# Patient Record
Sex: Female | Born: 1950 | Race: White | Smoking: Never smoker
Health system: Southern US, Community
[De-identification: ages and names within clinical notes are randomized; demographics above are authoritative.]

## PROBLEM LIST (undated history)

## (undated) DIAGNOSIS — H8109 Meniere's disease, unspecified ear: Secondary | ICD-10-CM

## (undated) DIAGNOSIS — R42 Dizziness and giddiness: Secondary | ICD-10-CM

## (undated) HISTORY — PX: AUGMENTATION MAMMAPLASTY: SUR837

## (undated) HISTORY — PX: ABDOMINAL HYSTERECTOMY: SHX81

---

## 2010-10-03 ENCOUNTER — Other Ambulatory Visit: Payer: Self-pay | Admitting: Family Medicine

## 2010-10-03 DIAGNOSIS — Z1231 Encounter for screening mammogram for malignant neoplasm of breast: Secondary | ICD-10-CM

## 2010-10-16 ENCOUNTER — Ambulatory Visit: Payer: Self-pay

## 2010-10-17 ENCOUNTER — Ambulatory Visit
Admission: RE | Admit: 2010-10-17 | Discharge: 2010-10-17 | Disposition: A | Discharge status: Home / Self Care | Payer: BC Managed Care – PPO | Source: Ambulatory Visit | Attending: Family Medicine | Admitting: Family Medicine

## 2010-10-17 DIAGNOSIS — Z1231 Encounter for screening mammogram for malignant neoplasm of breast: Secondary | ICD-10-CM

## 2010-10-18 ENCOUNTER — Ambulatory Visit: Payer: Self-pay

## 2012-10-26 ENCOUNTER — Emergency Department (HOSPITAL_COMMUNITY): Payer: BC Managed Care – PPO

## 2012-10-26 ENCOUNTER — Encounter (HOSPITAL_COMMUNITY): Payer: Self-pay | Admitting: Emergency Medicine

## 2012-10-26 ENCOUNTER — Emergency Department (HOSPITAL_COMMUNITY)
Admission: EM | Admit: 2012-10-26 | Discharge: 2012-10-26 | Disposition: A | Discharge status: Home / Self Care | Payer: BC Managed Care – PPO | Attending: Emergency Medicine | Admitting: Emergency Medicine

## 2012-10-26 DIAGNOSIS — R110 Nausea: Secondary | ICD-10-CM | POA: Insufficient documentation

## 2012-10-26 DIAGNOSIS — R55 Syncope and collapse: Secondary | ICD-10-CM

## 2012-10-26 DIAGNOSIS — R42 Dizziness and giddiness: Secondary | ICD-10-CM | POA: Insufficient documentation

## 2012-10-26 DIAGNOSIS — R5381 Other malaise: Secondary | ICD-10-CM | POA: Insufficient documentation

## 2012-10-26 DIAGNOSIS — R51 Headache: Secondary | ICD-10-CM | POA: Insufficient documentation

## 2012-10-26 DIAGNOSIS — IMO0001 Reserved for inherently not codable concepts without codable children: Secondary | ICD-10-CM | POA: Insufficient documentation

## 2012-10-26 DIAGNOSIS — M542 Cervicalgia: Secondary | ICD-10-CM | POA: Insufficient documentation

## 2012-10-26 DIAGNOSIS — H8109 Meniere's disease, unspecified ear: Secondary | ICD-10-CM | POA: Insufficient documentation

## 2012-10-26 HISTORY — DX: Dizziness and giddiness: R42

## 2012-10-26 HISTORY — DX: Meniere's disease, unspecified ear: H81.09

## 2012-10-26 LAB — CBC WITH DIFFERENTIAL/PLATELET
Basophils Relative: 1 % (ref 0–1)
Eosinophils Absolute: 0.3 10*3/uL (ref 0.0–0.7)
Hemoglobin: 14.8 g/dL (ref 12.0–15.0)
MCH: 31 pg (ref 26.0–34.0)
MCHC: 34.1 g/dL (ref 30.0–36.0)
Monocytes Absolute: 0.4 10*3/uL (ref 0.1–1.0)
Monocytes Relative: 9 % (ref 3–12)
Neutrophils Relative %: 62 % (ref 43–77)

## 2012-10-26 LAB — URINE MICROSCOPIC-ADD ON

## 2012-10-26 LAB — URINALYSIS, ROUTINE W REFLEX MICROSCOPIC
Nitrite: NEGATIVE
Specific Gravity, Urine: 1.011 (ref 1.005–1.030)
pH: 5 (ref 5.0–8.0)

## 2012-10-26 LAB — COMPREHENSIVE METABOLIC PANEL
Albumin: 4 g/dL (ref 3.5–5.2)
BUN: 18 mg/dL (ref 6–23)
Calcium: 9.4 mg/dL (ref 8.4–10.5)
Creatinine, Ser: 0.73 mg/dL (ref 0.50–1.10)
Total Protein: 7.1 g/dL (ref 6.0–8.3)

## 2012-10-26 LAB — D-DIMER, QUANTITATIVE: D-Dimer, Quant: <0.27 ug/mL-FEU (ref 0.00–0.48)

## 2012-10-26 MED ORDER — SODIUM CHLORIDE 0.9 % IV BOLUS (SEPSIS)
1000.0000 mL | Freq: Once | INTRAVENOUS | Status: AC
  Administered 2012-10-26: 1000 mL via INTRAVENOUS

## 2012-10-26 NOTE — ED Notes (Signed)
Patient ambulating pushing a shopping cart at [employer] syncope witnessed approx 10-20 seconds. EMS arrived ax4 answering and following commands appropriate. Nausea given zofran 4mg  IVP CBG 93.

## 2012-10-26 NOTE — ED Provider Notes (Signed)
History     CSN: [id number]  Arrival date & time 10/26/12  1207   First MD Initiated Contact with Patient 10/26/12 1216      Chief Complaint  Patient presents with  . Loss of Consciousness    (Consider location/radiation/quality/duration/timing/severity/associated sxs/prior treatment) HPI Comments: Patient arrives via EMS after syncopal episode in [place]. She remembers walking behind her cart, feeling lightheaded and dizzy with blurry vision, hot flushing and nausea. The next thing the patient she knows she was on the ground. She denies any chest pain or shortness of breath. She denies any previous cardiac history. She does have Mnire's disease for which he does not take any medications. She states the dizziness is different than her Mnire's and she denies any vertigo. She endorses nausea for the past several days with gradual onset headache that is similar to previous migraines. Denies any vision changes.  The history is provided by the patient and the EMS personnel.    Past Medical History  Diagnosis Date  . Dizziness   . Meniere's disease     History reviewed. No pertinent past surgical history.  No family history on file.  History  Substance Use Topics  . Smoking status: Never Smoker   . Smokeless tobacco: Not on file  . Alcohol Use: No    OB History   Grav Para Term Preterm Abortions TAB SAB Ect Mult Living                  Review of Systems  Constitutional: Positive for fatigue. Negative for fever and activity change.  HENT: Positive for neck pain. Negative for congestion and rhinorrhea.   Respiratory: Negative for cough, chest tightness and shortness of breath.   Cardiovascular: Positive for syncope. Negative for chest pain.  Gastrointestinal: Positive for nausea. Negative for vomiting and abdominal pain.  Genitourinary: Negative for dysuria and hematuria.  Musculoskeletal: Positive for myalgias and arthralgias.  Neurological: Positive for dizziness,  weakness, light-headedness and headaches.  A complete 10 system review of systems was obtained and all systems are negative except as noted in the HPI and PMH.    Allergies  Codeine  Home Medications   Current Outpatient Rx  Name  Route  Sig  Dispense  Refill  . Aspirin-Acetaminophen-Caffeine (GOODY HEADACHE PO)   Oral   Take 1 Package by mouth once. For headache         . esterified estrogens (MENEST) 0.625 MG tablet   Oral   Take 0.625 mg by mouth daily.         . fexofenadine (ALLEGRA) 180 MG tablet   Oral   Take 180 mg by mouth daily.         Marland Kitchen MAGNESIUM PO   Oral   Take 1 tablet by mouth daily.         . Multiple Vitamin (MULTIVITAMIN WITH MINERALS) TABS   Oral   Take 1 tablet by mouth daily.           BP 131/72  Pulse 86  Temp(Src) 98.4 F (36.9 C) (Oral)  Resp 19  Ht 5' 4.5" (1.638 m)  Wt 165 lb (74.844 kg)  BMI 27.9 kg/m2  SpO2 96%  Physical Exam  Constitutional: She is oriented to person, place, and time. She appears well-developed and well-nourished. No distress.  HENT:  Head: Normocephalic and atraumatic.  Mouth/Throat: Oropharynx is clear and moist. No oropharyngeal exudate.  Eyes: Conjunctivae and EOM are normal. Pupils are equal, round, and reactive to light.  Neck: Normal range of motion. Neck supple.  Cardiovascular: Normal rate, regular rhythm and normal heart sounds.   No murmur heard. Pulmonary/Chest: Effort normal and breath sounds normal. No respiratory distress.  Abdominal: Soft. There is no tenderness. There is no rebound and no guarding.  Musculoskeletal: Normal range of motion. She exhibits no edema and no tenderness.  Neurological: She is alert and oriented to person, place, and time. No cranial nerve deficit. She exhibits normal muscle tone. Coordination normal.  Cranial nerves 2-12 intact, 5 out of 5 strength throughout, no ataxia finger to nose  Skin: Skin is warm.    ED Course  Procedures (including critical care  time)  Labs Reviewed  CBC WITH DIFFERENTIAL - Abnormal; Notable for the following:    Eosinophils Relative 6 (*)    All other components within normal limits  COMPREHENSIVE METABOLIC PANEL - Abnormal; Notable for the following:    ALT 45 (*)    GFR calc non Af Amer 90 (*)    All other components within normal limits  URINALYSIS, ROUTINE W REFLEX MICROSCOPIC - Abnormal; Notable for the following:    Leukocytes, UA TRACE (*)    All other components within normal limits  URINE MICROSCOPIC-ADD ON - Abnormal; Notable for the following:    Squamous Epithelial / LPF FEW (*)    Bacteria, UA FEW (*)    All other components within normal limits  D-DIMER, QUANTITATIVE  TROPONIN I   Ct Head Wo Contrast  10/26/2012  *RADIOLOGY REPORT*  Clinical Data: Syncope  CT HEAD WITHOUT CONTRAST  Technique:  Contiguous axial images were obtained from the base of the skull through the vertex without contrast.  Comparison: None.  Findings: No evidence of parenchymal hemorrhage or extra-axial fluid collection. No mass lesion, mass effect, or midline shift.  No CT evidence of acute infarction.  Cerebral volume is age appropriate.  No ventriculomegaly.  Suspected prior functional endoscopic sinus surgery.  Mastoid air cells are clear.  No evidence of calvarial fracture.  IMPRESSION: No evidence of acute intracranial abnormality.   Original Report Authenticated By: Charline Bills, M.D.      1. Syncope       MDM  Syncopal episode likely vasovagal with prodrome of nausea, blurry vision, hot flushing. No chest pain or shortness of breath. Back to baseline now.   Nonfocal neurological exam. Patient complains of gradual onset headache similar to previous. No chest pain or shortness of breath.  EKG shows no arrhythmias. Lab work is unremarkable. She's no murmurs on exam. Low suspicion for cardiac syncope. She's a history of Mnire's disease endorses a program of dizziness, lightheadedness, nausea and hot  flushing.  She is back to her baseline, tolerating by mouth ambulatory in the hallways. Followup with PCP this week. Return precautions discussed.   Date: 10/26/2012  Rate: 64  Rhythm: normal sinus rhythm  QRS Axis: normal  Intervals: normal  ST/T Wave abnormalities: normal  Conduction Disutrbances:none  Narrative Interpretation:   Old EKG Reviewed: none available       Glynn Octave, MD 10/26/12 1525

## 2012-10-28 ENCOUNTER — Other Ambulatory Visit: Payer: Self-pay | Admitting: Family Medicine

## 2012-10-28 DIAGNOSIS — R2689 Other abnormalities of gait and mobility: Secondary | ICD-10-CM

## 2012-11-01 ENCOUNTER — Ambulatory Visit
Admission: RE | Admit: 2012-11-01 | Discharge: 2012-11-01 | Disposition: A | Discharge status: Home / Self Care | Payer: BC Managed Care – PPO | Source: Ambulatory Visit | Attending: Family Medicine | Admitting: Family Medicine

## 2012-11-01 DIAGNOSIS — R2689 Other abnormalities of gait and mobility: Secondary | ICD-10-CM

## 2013-10-27 ENCOUNTER — Other Ambulatory Visit: Payer: Self-pay

## 2013-10-27 DIAGNOSIS — Z1231 Encounter for screening mammogram for malignant neoplasm of breast: Secondary | ICD-10-CM

## 2013-11-17 ENCOUNTER — Ambulatory Visit
Admission: RE | Admit: 2013-11-17 | Discharge: 2013-11-17 | Disposition: A | Discharge status: Home / Self Care | Payer: BC Managed Care – PPO | Source: Ambulatory Visit

## 2013-11-17 DIAGNOSIS — Z1231 Encounter for screening mammogram for malignant neoplasm of breast: Secondary | ICD-10-CM

## 2016-02-14 ENCOUNTER — Other Ambulatory Visit: Payer: Self-pay | Admitting: Family Medicine

## 2016-02-14 DIAGNOSIS — Z1231 Encounter for screening mammogram for malignant neoplasm of breast: Secondary | ICD-10-CM

## 2016-03-05 ENCOUNTER — Ambulatory Visit
Admission: RE | Admit: 2016-03-05 | Discharge: 2016-03-05 | Disposition: A | Discharge status: Home / Self Care | Payer: Self-pay | Source: Ambulatory Visit | Attending: Family Medicine | Admitting: Family Medicine

## 2016-03-05 DIAGNOSIS — Z1231 Encounter for screening mammogram for malignant neoplasm of breast: Secondary | ICD-10-CM

## 2017-03-10 ENCOUNTER — Other Ambulatory Visit: Payer: Self-pay | Admitting: Gastroenterology

## 2017-03-10 DIAGNOSIS — R1013 Epigastric pain: Secondary | ICD-10-CM

## 2017-03-13 ENCOUNTER — Ambulatory Visit
Admission: RE | Admit: 2017-03-13 | Discharge: 2017-03-13 | Disposition: A | Discharge status: Home / Self Care | Payer: BLUE CROSS/BLUE SHIELD | Source: Ambulatory Visit | Attending: Gastroenterology | Admitting: Gastroenterology

## 2017-03-13 DIAGNOSIS — R1013 Epigastric pain: Secondary | ICD-10-CM

## 2017-03-13 MED ORDER — IOPAMIDOL (ISOVUE-300) INJECTION 61%
100.0000 mL | Freq: Once | INTRAVENOUS | Status: AC | PRN
  Administered 2017-03-13: 100 mL via INTRAVENOUS

## 2017-07-09 ENCOUNTER — Telehealth: Payer: Self-pay | Admitting: *Deleted

## 2017-07-09 ENCOUNTER — Ambulatory Visit (INDEPENDENT_AMBULATORY_CARE_PROVIDER_SITE_OTHER): Payer: BLUE CROSS/BLUE SHIELD | Admitting: Podiatry

## 2017-07-09 ENCOUNTER — Ambulatory Visit (INDEPENDENT_AMBULATORY_CARE_PROVIDER_SITE_OTHER): Payer: BLUE CROSS/BLUE SHIELD

## 2017-07-09 VITALS — BP 145/92 | HR 75 | Ht 64.0 in | Wt 175.0 lb

## 2017-07-09 DIAGNOSIS — S93325A Dislocation of tarsometatarsal joint of left foot, initial encounter: Secondary | ICD-10-CM

## 2017-07-09 DIAGNOSIS — M21611 Bunion of right foot: Secondary | ICD-10-CM

## 2017-07-09 DIAGNOSIS — M79671 Pain in right foot: Secondary | ICD-10-CM | POA: Diagnosis not present

## 2017-07-09 DIAGNOSIS — M2041 Other hammer toe(s) (acquired), right foot: Secondary | ICD-10-CM | POA: Diagnosis not present

## 2017-07-09 MED ORDER — MELOXICAM 15 MG PO TABS: 15.0000 mg | ORAL_TABLET | Freq: Every day | ORAL | 1 refills | Status: DC

## 2017-07-09 NOTE — Telephone Encounter (Deleted)
-----   Message from Brent M Evans, DPM sent at 07/09/2017  4:08 PM EST ----- Regarding: MRI right foot Please order MRI right foot with or without contrast.  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 Evans 
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
## 2017-07-10 NOTE — Telephone Encounter (Signed)
-----   Message from Felecia ShellingBrent M Evans, DPM sent at 07/09/2017  4:08 PM EST ----- Regarding: MRI right foot Please order MRI right foot with or without contrast.  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 Logan BoresEvans

## 2017-07-10 NOTE — Telephone Encounter (Signed)
Orders to J. Quintana, RN for pre-cert, faxed to [facility]. 
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
## 2017-07-17 ENCOUNTER — Other Ambulatory Visit: Payer: Self-pay

## 2017-07-17 NOTE — Progress Notes (Signed)
error 

## 2017-07-18 ENCOUNTER — Ambulatory Visit
Admission: RE | Admit: 2017-07-18 | Discharge: 2017-07-18 | Disposition: A | Discharge status: Home / Self Care | Payer: BLUE CROSS/BLUE SHIELD | Source: Ambulatory Visit | Attending: Podiatry | Admitting: Podiatry

## 2017-08-06 ENCOUNTER — Ambulatory Visit (INDEPENDENT_AMBULATORY_CARE_PROVIDER_SITE_OTHER): Payer: BLUE CROSS/BLUE SHIELD | Admitting: Podiatry

## 2017-08-06 DIAGNOSIS — M76821 Posterior tibial tendinitis, right leg: Secondary | ICD-10-CM | POA: Diagnosis not present

## 2017-08-06 DIAGNOSIS — M779 Enthesopathy, unspecified: Principal | ICD-10-CM

## 2017-08-06 DIAGNOSIS — M7751 Other enthesopathy of right foot: Secondary | ICD-10-CM | POA: Diagnosis not present

## 2017-08-06 DIAGNOSIS — M778 Other enthesopathies, not elsewhere classified: Secondary | ICD-10-CM

## 2017-08-06 MED ORDER — MELOXICAM 15 MG PO TABS: 15.0000 mg | ORAL_TABLET | Freq: Every day | ORAL | 1 refills | Status: AC

## 2017-08-07 ENCOUNTER — Telehealth: Payer: Self-pay | Admitting: Podiatry

## 2017-08-07 NOTE — Telephone Encounter (Signed)
I informed pt Dr. Logan BoresEvans had sent the prescription to [id number] 08/06/2017 at 4:51pm.

## 2017-08-07 NOTE — Telephone Encounter (Signed)
I was there yesterday and saw Dr. Logan BoresEvans. He was supposed to write me a prescription but I did not get anything. I was calling to see if he was going to call that in to my pharmacy [facility] on [address]. If not, please call me back at [phone].

## 2017-08-13 NOTE — Progress Notes (Signed)
   HPI: 67 year old female presenting today for follow-up evaluation of right foot pain.  She reports significant continued pain.  She rates this pain at 4/10 currently.  She is here to discuss her MRI results.  Patient is here for further evaluation and treatment.   Past Medical History:  Diagnosis Date  . Dizziness   . Meniere's disease      Physical Exam: General: The patient is alert and oriented x3 in no acute distress.  Dermatology: Skin is warm, dry and supple bilateral lower extremities. Negative for open lesions or macerations.  Vascular: Palpable pedal pulses bilaterally. No edema or erythema noted. Capillary refill within normal limits.  Neurological: Epicritic and protective threshold grossly intact bilaterally.   Musculoskeletal Exam: Pain with palpation to the right midfoot.  Pain with palpation at the insertion of the posterior tibial tendon of the right foot.  Range of motion within normal limits to all pedal and ankle joints bilateral. Muscle strength 5/5 in all groups bilateral.   MRI results: 1.  Intact Lisfranc ligament.  No acute fracture or dislocation of the right forefoot. 2.  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 3.  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 4.  Mild edema in the medial hallux sesamoid likely reflecting mild sesamoiditis.  Assessment: -Right midfoot capsulitis -Insertional PT tendinitis right -OA right foot   Plan of Care:  -Patient evaluated.  MRI reviewed. - Injection of 0.5 mLs Celestone Soluspan injected into the right midfoot. - Injection of 0.5 mLs Celestone Soluspan injected into the insertion of the right PT tendon.  Care was taken to avoid direct injection of the tendon. -Cam boot dispensed.  Weightbearing as tolerated for 4 weeks. -Prescription for Mobic provided to patient. -Return to clinic in 4  weeks.   Felecia ShellingBrent M. Gladyes Kudo, DPM [facility]  Dr. Felecia ShellingBrent M. Keyuana Wank, DPM    [address]                                        [place], [place] [address]                Office [phone]  Fax [phone]

## 2017-09-02 ENCOUNTER — Telehealth: Payer: Self-pay | Admitting: *Deleted

## 2017-09-02 NOTE — Telephone Encounter (Signed)
Request #90 meloxicam. Dr. Logan BoresEvans denies refill states pt needs to be seen prior to refills. Return fax denying.

## 2017-09-03 ENCOUNTER — Ambulatory Visit (INDEPENDENT_AMBULATORY_CARE_PROVIDER_SITE_OTHER): Payer: BLUE CROSS/BLUE SHIELD | Admitting: Podiatry

## 2017-09-03 DIAGNOSIS — M19171 Post-traumatic osteoarthritis, right ankle and foot: Secondary | ICD-10-CM

## 2017-09-03 DIAGNOSIS — M7751 Other enthesopathy of right foot: Secondary | ICD-10-CM | POA: Diagnosis not present

## 2017-09-03 DIAGNOSIS — M778 Other enthesopathies, not elsewhere classified: Secondary | ICD-10-CM

## 2017-09-03 DIAGNOSIS — M779 Enthesopathy, unspecified: Principal | ICD-10-CM

## 2017-09-03 MED ORDER — DICLOFENAC SODIUM 75 MG PO TBEC: 75.0000 mg | DELAYED_RELEASE_TABLET | Freq: Two times a day (BID) | ORAL | 1 refills | Status: DC

## 2017-09-03 MED ORDER — METHYLPREDNISOLONE 4 MG PO TBPK: ORAL_TABLET | ORAL | 0 refills | Status: DC

## 2017-09-06 NOTE — Progress Notes (Signed)
   HPI: 67 year old female presenting today for follow-up evaluation of right foot pain. She reports some improvement of the right foot since the previous visit. She states wearing the CAM boot is beginning to cause pain in the left hip. She states the injections helped alleviate her pain but she is unsure if Mobic is providing any relief. Patient is here for further evaluation and treatment.   Past Medical History:  Diagnosis Date  . Dizziness   . Meniere's disease      Physical Exam: General: The patient is alert and oriented x3 in no acute distress.  Dermatology: Skin is warm, dry and supple bilateral lower extremities. Negative for open lesions or macerations.  Vascular: Palpable pedal pulses bilaterally. No edema or erythema noted. Capillary refill within normal limits.  Neurological: Epicritic and protective threshold grossly intact bilaterally.   Musculoskeletal Exam: Pain with palpation to the right midfoot. No pain with palpation at the insertion of the posterior tibial tendon of the right foot.  Range of motion within normal limits to all pedal and ankle joints bilateral. Muscle strength 5/5 in all groups bilateral.   Assessment: - Right midfoot OA/capsulitis - Insertional PT tendinitis right - resolved  Plan of Care:  - Patient evaluated.   - Injection of 0.5 mLs Celestone Soluspan injected into the right midfoot. - Discontinue wearing CAM boot. Transition into good shoes and ankle brace. - Ankle brace dispensed. - Prescription for Medrol Dose Pak provided to patient. - Prescription for Diclofenac 75 mg provided to patient. Discontinue taking Meloxicam 15 mg. - Return to clinic in 4 weeks.  Felecia ShellingBrent M. Evans, DPM [facility]  Dr. Felecia ShellingBrent M. Evans, DPM    [address]                                        [place], [place] [address]                Office [phone]  Fax [phone]) [phone]

## 2017-09-29 ENCOUNTER — Other Ambulatory Visit: Payer: Self-pay | Admitting: Nurse Practitioner

## 2017-09-29 ENCOUNTER — Telehealth: Payer: Self-pay | Admitting: *Deleted

## 2017-09-29 ENCOUNTER — Other Ambulatory Visit: Payer: Self-pay | Admitting: Family Medicine

## 2017-09-29 ENCOUNTER — Ambulatory Visit
Admission: RE | Admit: 2017-09-29 | Discharge: 2017-09-29 | Disposition: A | Discharge status: Home / Self Care | Payer: BLUE CROSS/BLUE SHIELD | Source: Ambulatory Visit | Attending: Family Medicine | Admitting: Family Medicine

## 2017-09-29 DIAGNOSIS — J40 Bronchitis, not specified as acute or chronic: Secondary | ICD-10-CM

## 2017-09-29 MED ORDER — DICLOFENAC SODIUM 75 MG PO TBEC: 75.0000 mg | DELAYED_RELEASE_TABLET | Freq: Two times a day (BID) | ORAL | 0 refills | Status: DC

## 2017-09-29 NOTE — Telephone Encounter (Signed)
Request for 180 Diclofenac. Dr. Philomena DohenyEvans okayed fill as #180.

## 2017-10-01 ENCOUNTER — Ambulatory Visit (INDEPENDENT_AMBULATORY_CARE_PROVIDER_SITE_OTHER): Payer: BLUE CROSS/BLUE SHIELD | Admitting: Podiatry

## 2017-10-01 DIAGNOSIS — M21619 Bunion of unspecified foot: Secondary | ICD-10-CM

## 2017-10-01 DIAGNOSIS — M2041 Other hammer toe(s) (acquired), right foot: Secondary | ICD-10-CM | POA: Diagnosis not present

## 2017-10-01 DIAGNOSIS — M779 Enthesopathy, unspecified: Principal | ICD-10-CM

## 2017-10-01 DIAGNOSIS — M19171 Post-traumatic osteoarthritis, right ankle and foot: Secondary | ICD-10-CM | POA: Diagnosis not present

## 2017-10-01 DIAGNOSIS — M7751 Other enthesopathy of right foot: Secondary | ICD-10-CM | POA: Diagnosis not present

## 2017-10-01 DIAGNOSIS — M778 Other enthesopathies, not elsewhere classified: Secondary | ICD-10-CM

## 2017-10-03 NOTE — Progress Notes (Signed)
   HPI: 67 year old female presenting today for follow-up evaluation of right foot pain. She reports continued aching pain of the foot and has been wearing the ankle brace as directed. She states she is unsure if the brace is helping or not. Patient is here for further evaluation and treatment.   Past Medical History:  Diagnosis Date  . Dizziness   . Meniere's disease      Physical Exam: General: The patient is alert and oriented x3 in no acute distress.  Dermatology: Skin is warm, dry and supple bilateral lower extremities. Negative for open lesions or macerations.  Vascular: Palpable pedal pulses bilaterally. No edema or erythema noted. Capillary refill within normal limits.  Neurological: Epicritic and protective threshold grossly intact bilaterally.   Musculoskeletal Exam: Clinical evidence of bunion deformity noted to the respective foot. There is a moderate pain on palpation range of motion of the first MPJ. Lateral deviation of the hallux noted consistent with hallux abductovalgus.  Hammertoe contracture also noted on clinical exam to the 2nd digit of the right foot. Symptomatic pain on palpation and range of motion also noted to the metatarsal phalangeal joints of the respective hammertoe digits.     Assessment: - Right midfoot OA/capsulitis - resolved - Insertional PT tendinitis right - resolved - HAV w/ bunion deformity right foot  - Hammertoe right 2nd toe   Plan of Care:  - Patient evaluated.   - Continue wearing good shoe gear.  - May resume full activity with no restrictions.  - Discussed possible need for bunionectomy and hammertoe repair surgery if symptoms worsen.  - Return to clinic as needed.   Felecia ShellingBrent M. Evans, DPM [facility]  Dr. Felecia ShellingBrent M. Evans, DPM    [address]                                        [place], [place] [address]                Office [phone]  Fax [phone]

## 2018-02-09 ENCOUNTER — Other Ambulatory Visit: Payer: Self-pay | Admitting: Family Medicine

## 2018-02-09 DIAGNOSIS — Z1231 Encounter for screening mammogram for malignant neoplasm of breast: Secondary | ICD-10-CM

## 2018-03-05 ENCOUNTER — Ambulatory Visit
Admission: RE | Admit: 2018-03-05 | Discharge: 2018-03-05 | Disposition: A | Discharge status: Home / Self Care | Payer: BLUE CROSS/BLUE SHIELD | Source: Ambulatory Visit | Attending: Family Medicine | Admitting: Family Medicine

## 2018-03-05 DIAGNOSIS — Z1231 Encounter for screening mammogram for malignant neoplasm of breast: Secondary | ICD-10-CM

## 2018-03-06 ENCOUNTER — Other Ambulatory Visit: Payer: Self-pay | Admitting: Family Medicine

## 2018-03-06 DIAGNOSIS — R928 Other abnormal and inconclusive findings on diagnostic imaging of breast: Secondary | ICD-10-CM

## 2018-03-12 ENCOUNTER — Ambulatory Visit: Admission: RE | Admit: 2018-03-12 | Payer: BLUE CROSS/BLUE SHIELD | Source: Ambulatory Visit

## 2018-03-12 ENCOUNTER — Ambulatory Visit
Admission: RE | Admit: 2018-03-12 | Discharge: 2018-03-12 | Disposition: A | Discharge status: Home / Self Care | Payer: BLUE CROSS/BLUE SHIELD | Source: Ambulatory Visit | Attending: Family Medicine | Admitting: Family Medicine

## 2018-03-12 DIAGNOSIS — R928 Other abnormal and inconclusive findings on diagnostic imaging of breast: Secondary | ICD-10-CM

## 2018-04-14 ENCOUNTER — Telehealth: Payer: Self-pay | Admitting: *Deleted

## 2018-04-14 MED ORDER — DICLOFENAC SODIUM 75 MG PO TBEC: 75.0000 mg | DELAYED_RELEASE_TABLET | Freq: Two times a day (BID) | ORAL | 0 refills | Status: DC

## 2018-04-14 NOTE — Telephone Encounter (Signed)
Refill request for diclofenac. Dr. Logan Bores states refill once and needs an appt prior to future refills.

## 2019-02-26 ENCOUNTER — Other Ambulatory Visit: Payer: Self-pay | Admitting: Family Medicine

## 2019-02-26 DIAGNOSIS — Z1231 Encounter for screening mammogram for malignant neoplasm of breast: Secondary | ICD-10-CM

## 2019-04-14 ENCOUNTER — Other Ambulatory Visit: Payer: Self-pay

## 2019-04-14 ENCOUNTER — Ambulatory Visit
Admission: RE | Admit: 2019-04-14 | Discharge: 2019-04-14 | Disposition: A | Discharge status: Home / Self Care | Payer: BC Managed Care – PPO | Source: Ambulatory Visit | Attending: Family Medicine | Admitting: Family Medicine

## 2019-04-14 DIAGNOSIS — Z1231 Encounter for screening mammogram for malignant neoplasm of breast: Secondary | ICD-10-CM

## 2019-08-12 ENCOUNTER — Ambulatory Visit (HOSPITAL_COMMUNITY)
Admission: EM | Admit: 2019-08-12 | Discharge: 2019-08-12 | Disposition: A | Discharge status: Home / Self Care | Payer: Medicare Other | Attending: Family Medicine | Admitting: Family Medicine

## 2019-08-12 ENCOUNTER — Encounter (HOSPITAL_COMMUNITY): Payer: Self-pay

## 2019-08-12 ENCOUNTER — Other Ambulatory Visit: Payer: Self-pay

## 2019-08-12 DIAGNOSIS — Z20822 Contact with and (suspected) exposure to covid-19: Secondary | ICD-10-CM

## 2019-08-12 DIAGNOSIS — U071 COVID-19: Secondary | ICD-10-CM | POA: Diagnosis not present

## 2019-08-12 DIAGNOSIS — J069 Acute upper respiratory infection, unspecified: Secondary | ICD-10-CM | POA: Diagnosis not present

## 2019-08-12 DIAGNOSIS — Z20828 Contact with and (suspected) exposure to other viral communicable diseases: Secondary | ICD-10-CM | POA: Diagnosis not present

## 2019-08-12 NOTE — ED Notes (Signed)
Provided patient with discharge instructions.

## 2019-08-12 NOTE — ED Triage Notes (Signed)
Pt presents to the [facility] with cough x 1 week; palpitations on and off x 1 week. Pt reports her husband testes positive for COVID yesterday.

## 2019-08-12 NOTE — Discharge Instructions (Addendum)
Home to rest Push fluids Quarantine until results are available You can check your results in myChart You may take Tylenol for pain and fever You may take over-the-counter cough and cold medicines as needed Call for questions or problems

## 2019-08-12 NOTE — ED Provider Notes (Signed)
[facility]    CSN: [id number] Arrival date & time: 08/12/19  1615      History   Chief Complaint Chief Complaint  Patient presents with  . Appointment    1610  . Cough  . Palpitations    HPI Ruth Wright is a 68 y.o. female.   HPI  Patient's husband was admitted to the hospital yesterday with coronavirus.  She has symptoms of cough, fatigue, and some runs of rapid heartbeat.  She states that she has not had any fever or chills, body aches.  She is concerned about Covid.  She wants to know if she is positive so she can contact her family.  She is 68 years old, but in good health, some arthritis but otherwise states she does not have ongoing medical problems.  Past Medical History:  Diagnosis Date  . Dizziness   . Meniere's disease     There are no problems to display for this patient.   Past Surgical History:  Procedure Laterality Date  . AUGMENTATION MAMMAPLASTY      OB History   No obstetric history on file.      Home Medications    Prior to Admission medications   Medication Sig Start Date End Date Taking? Authorizing Provider  acetaminophen (TYLENOL) 500 MG tablet Take 500 mg by mouth every 6 (six) hours as needed.   Yes [provider]  Aspirin-Acetaminophen-Caffeine (GOODY HEADACHE PO) Take 1 Package by mouth once. For headache    [provider]  esomeprazole (NEXIUM) 40 MG capsule Take 40 mg by mouth daily. 05/30/17   [provider]  estradiol (ESTRACE) 0.5 MG tablet Take 0.5 mg by mouth daily. 06/18/19   [provider]  Multiple Vitamin (MULTIVITAMIN WITH MINERALS) TABS Take 1 tablet by mouth daily.    [provider]  predniSONE (DELTASONE) 20 MG tablet TAKE 2 BY MOUTH NOW AND EVERY DAY UNTIL BETTER THEN DECREASE BY 1/2 TABLET EVERY OTHER DAY 08/02/19   [provider]  esterified estrogens (MENEST) 0.625 MG tablet Take 0.625 mg by mouth daily.  08/12/19  [provider]   fexofenadine (ALLEGRA) 180 MG tablet Take 180 mg by mouth daily.  08/12/19  [provider]    Family History History reviewed. No pertinent family history.  Social History Social History   Tobacco Use  . Smoking status: Never Smoker  . Smokeless tobacco: Never Used  Substance Use Topics  . Alcohol use: No  . Drug use: No     Allergies   Codeine   Review of Systems Review of Systems  Constitutional: Positive for fatigue. Negative for chills and fever.  HENT: Negative for congestion and hearing loss.   Eyes: Negative for pain.  Respiratory: Positive for cough. Negative for shortness of breath.   Cardiovascular: Positive for palpitations. Negative for chest pain and leg swelling.  Gastrointestinal: Negative for abdominal pain, constipation and diarrhea.  Genitourinary: Negative for dysuria and frequency.  Musculoskeletal: Negative for myalgias.  Neurological: Negative for dizziness, seizures and headaches.  Psychiatric/Behavioral: The patient is not nervous/anxious.      Physical Exam Triage Vital Signs ED Triage Vitals  Enc Vitals Group     BP 08/12/19 1652 (!) 153/93     Pulse Rate 08/12/19 1652 84     Resp 08/12/19 1652 16     Temp 08/12/19 1652 98.3 F (36.8 C)     Temp Source 08/12/19 1652 Oral     SpO2 08/12/19 1652  95 %     Weight --      Height --      Head Circumference --      Peak Flow --      Pain Score 08/12/19 1649 0     Pain Loc --      Pain Edu? --      Excl. in [place]? --    No data found.  Updated Vital Signs BP (!) 153/93 (BP Location: Left Arm)   Pulse 84   Temp 98.3 F (36.8 C) (Oral)   Resp 16   SpO2 95%   Visual Acuity Right Eye Distance:   Left Eye Distance:   Bilateral Distance:    Right Eye Near:   Left Eye Near:    Bilateral Near:     Physical Exam Constitutional:      General: She is not in acute distress.    Appearance: She is well-developed and normal weight.  HENT:     Head: Normocephalic and  atraumatic.     Mouth/Throat:     Comments: Mask is in place Eyes:     Conjunctiva/sclera: Conjunctivae normal.     Pupils: Pupils are equal, round, and reactive to light.  Cardiovascular:     Rate and Rhythm: Normal rate and regular rhythm.     Heart sounds: Normal heart sounds.     Comments: Regular heart Pulmonary:     Effort: Pulmonary effort is normal. No respiratory distress.     Breath sounds: Normal breath sounds.     Comments: Lungs are clear Abdominal:     General: There is no distension.     Palpations: Abdomen is soft.  Musculoskeletal:        General: Normal range of motion.     Cervical back: Normal range of motion.  Skin:    General: Skin is warm and dry.  Neurological:     Mental Status: She is alert.  Psychiatric:        Mood and Affect: Mood normal.        Behavior: Behavior normal.      UC Treatments / Results  Labs (all labs ordered are listed, but only abnormal results are displayed) Labs Reviewed  NOVEL CORONAVIRUS, NAA (HOSP ORDER, SEND-OUT TO REF LAB; TAT 18-24 HRS)    EKG   Radiology No results found.  Procedures Procedures (including critical care time)  Medications Ordered in UC Medications - No data to display  Initial Impression / Assessment and Plan / UC Course  I have reviewed the triage vital signs and the nursing notes.  Pertinent labs & imaging results that were available during my care of the patient were reviewed by me and considered in my medical decision making (see chart for details).     Reviewed signs and symptoms of Covid.  Reasons for return. Final Clinical Impressions(s) / UC Diagnoses   Final diagnoses:  Close exposure to COVID-19 virus  Viral URI with cough     Discharge Instructions     Home to rest Push fluids Quarantine until results are available You can check your results in myChart You may take Tylenol for pain and fever You may take over-the-counter cough and cold medicines as needed Call  for questions or problems    ED Prescriptions    None     PDMP not reviewed this encounter.   Raylene Everts, MD 08/12/19 2007

## 2019-08-13 LAB — NOVEL CORONAVIRUS, NAA (HOSP ORDER, SEND-OUT TO REF LAB; TAT 18-24 HRS): SARS-CoV-2, NAA: DETECTED — AB

## 2019-08-14 ENCOUNTER — Telehealth: Payer: Self-pay | Admitting: Unknown Physician Specialty

## 2019-08-14 NOTE — Telephone Encounter (Signed)
Discussed with patient about Covid symptoms and the use of bamlanivimab, a monoclonal antibody infusion for those with mild to moderate Covid symptoms and at a high risk of hospitalization.  Symptoms ongoing for longer than 10 days and not a candidate for infusion

## 2019-08-16 ENCOUNTER — Telehealth (HOSPITAL_COMMUNITY): Payer: Self-pay | Admitting: Emergency Medicine

## 2019-08-16 NOTE — Telephone Encounter (Signed)

## 2020-04-18 ENCOUNTER — Other Ambulatory Visit: Payer: Self-pay | Admitting: Family Medicine

## 2020-04-18 DIAGNOSIS — Z1231 Encounter for screening mammogram for malignant neoplasm of breast: Secondary | ICD-10-CM

## 2020-05-02 ENCOUNTER — Ambulatory Visit
Admission: RE | Admit: 2020-05-02 | Discharge: 2020-05-02 | Disposition: A | Discharge status: Home / Self Care | Payer: Medicare HMO | Source: Ambulatory Visit | Attending: Family Medicine | Admitting: Family Medicine

## 2020-05-02 ENCOUNTER — Other Ambulatory Visit: Payer: Self-pay

## 2020-05-02 DIAGNOSIS — Z1231 Encounter for screening mammogram for malignant neoplasm of breast: Secondary | ICD-10-CM
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
## 2020-07-27 ENCOUNTER — Encounter: Payer: Self-pay | Admitting: Obstetrics and Gynecology

## 2020-07-27 ENCOUNTER — Ambulatory Visit (INDEPENDENT_AMBULATORY_CARE_PROVIDER_SITE_OTHER): Payer: Medicare HMO | Admitting: Obstetrics and Gynecology

## 2020-07-27 ENCOUNTER — Other Ambulatory Visit: Payer: Self-pay

## 2020-07-27 VITALS — BP 143/89 | HR 77 | Ht 64.0 in | Wt 171.0 lb

## 2020-07-27 DIAGNOSIS — N816 Rectocele: Secondary | ICD-10-CM | POA: Diagnosis not present

## 2020-07-27 DIAGNOSIS — R350 Frequency of micturition: Secondary | ICD-10-CM | POA: Diagnosis not present

## 2020-07-27 DIAGNOSIS — N3941 Urge incontinence: Secondary | ICD-10-CM | POA: Diagnosis not present

## 2020-07-27 DIAGNOSIS — N393 Stress incontinence (female) (male): Secondary | ICD-10-CM | POA: Diagnosis not present

## 2020-07-27 LAB — POCT URINALYSIS DIPSTICK
Appearance: NORMAL
Bilirubin, UA: NEGATIVE
Blood, UA: NEGATIVE
Glucose, UA: NEGATIVE
Ketones, UA: NEGATIVE
Leukocytes, UA: NEGATIVE
Nitrite, UA: NEGATIVE
Protein, UA: NEGATIVE
Spec Grav, UA: 1.01 (ref 1.010–1.025)
Urobilinogen, UA: 0.2 E.U./dL
pH, UA: 5 (ref 5.0–8.0)

## 2020-07-27 NOTE — Patient Instructions (Signed)
Today we talked about ways to manage bladder urgency such as altering your diet to avoid irritative beverages and foods (bladder diet) as well as attempting to decrease stress and other exacerbating factors.   The Most Bothersome Foods* The Least Bothersome Foods*  Coffee - Regular & Decaf Tea - caffeinated Carbonated beverages - cola, non-colas, diet & caffeine-free Alcohols - Beer, Red Wine, White Wine, [address] - Grapefruit, [place], Orange, [employer] - Cranberry, Grapefruit, Orange, Pineapple Vegetables - Tomato & Tomato Products Flavor Enhancers - Hot peppers, Spicy foods, Chili, Horseradish, Vinegar, Monosodium glutamate (MSG) Artificial Sweeteners - NutraSweet, Sweet 'N Low, Equal (sweetener), Saccharin Ethnic foods - [place], [place], [place] food [employer] - low-fat & whole Fruits - Bananas, Blueberries, Honeydew melon, Pears, Raisins, Watermelon Vegetables - Broccoli, [address] Sprouts, [place], Carrots, Cauliflower, [place], Cucumber, Mushrooms, Peas, Radishes, Squash, Zucchini, White potatoes, Sweet potatoes & yams Poultry - Chicken, Eggs, [place], [employer] - Beef, Diplomatic Services operational officer, Lamb Seafood - Shrimp, [place] fish, Salmon Grains - Oat, Rice Snacks - Pretzels, Popcorn  *Lenward Chancellor et al. Diet and its role in interstitial cystitis/bladder pain syndrome (IC/BPS) and comorbid conditions. [employer]. BJU Int. 2012 Jan 11.    For treatment of stress urinary incontinence, which is leakage with physical activity/movement/strainging/coughing, we discussed expectant management versus nonsurgical options versus surgery. Nonsurgical options include weight loss, physical therapy, as well as a pessary and surgical options.

## 2020-09-12 ENCOUNTER — Telehealth: Payer: Self-pay | Admitting: Obstetrics and Gynecology

## 2020-09-12 NOTE — Telephone Encounter (Signed)
Pt called asking for a refill on her estradiol tablets.  Advised that she would need to call the doctor who originally prescribed her the medication.  She states that was Dr Dion Body who is no longer practicing there. I advised her to call the office, which she stated was [facility]. I provided her with the number. Advised for her to call back if she had any issues reaching them. Pt verbalized understanding.

## 2020-09-22 NOTE — Progress Notes (Deleted)
[facility] Return Visit  SUBJECTIVE  History of Present Illness: Eldean Klatt is a 70 y.o. female seen in follow-up for mixed incontinence. Plan at last visit was to work on reducing bladder irritants and bladder retraining.     Past Medical History: Patient  has a past medical history of Dizziness and Meniere's disease.   Past Surgical History: She  has a past surgical history that includes Augmentation mammaplasty and Abdominal hysterectomy.   Medications: She has a current medication list which includes the following prescription(s): acetaminophen, calcium carbonate, vitamin b12 tr, diclofenac, esomeprazole, estradiol, magnesium, multivitamin with minerals, [DISCONTINUED] esterified estrogens, and [DISCONTINUED] fexofenadine.   Allergies: Patient is allergic to codeine.   Social History: Patient  reports that she has never smoked. She has never used smokeless tobacco. She reports that she does not drink alcohol and does not use drugs.      OBJECTIVE     Physical Exam: There were no vitals filed for this visit. Gen: No apparent distress, A&O x 3.  Detailed Urogynecologic Evaluation:  Deferred. Prior exam showed:  No flowsheet data found.     ASSESSMENT AND PLAN    Ms. Ambriz is a 70 y.o. with:  No diagnosis found.

## 2020-09-28 ENCOUNTER — Ambulatory Visit: Payer: Medicare HMO | Admitting: Obstetrics and Gynecology

## 2020-09-29 ENCOUNTER — Ambulatory Visit: Payer: Medicare HMO | Admitting: Obstetrics and Gynecology

## 2020-10-25 NOTE — Progress Notes (Deleted)
[facility] Return Visit  SUBJECTIVE  History of Present Illness: Ruth Wright is a 70 y.o. female seen in follow-up for mixed incontinence. Plan at last visit was decrease bladder irritants and increase water intake. She was also potentially interested in a pessary for SUI symtpoms.     Past Medical History: Patient  has a past medical history of Dizziness and Meniere's disease.   Past Surgical History: She  has a past surgical history that includes Augmentation mammaplasty and Abdominal hysterectomy.   Medications: She has a current medication list which includes the following prescription(s): acetaminophen, calcium carbonate, vitamin b12 tr, diclofenac, esomeprazole, estradiol, magnesium, multivitamin with minerals, [DISCONTINUED] esterified estrogens, and [DISCONTINUED] fexofenadine.   Allergies: Patient is allergic to codeine.   Social History: Patient  reports that she has never smoked. She has never used smokeless tobacco. She reports that she does not drink alcohol and does not use drugs.      OBJECTIVE     Physical Exam: There were no vitals filed for this visit. Gen: No apparent distress, A&O x 3.  Detailed Urogynecologic Evaluation:  Deferred. Prior exam showed:  POP-Q (07/27/20):   POP-Q  -2                                            Aa   -2                                           Ba  -7                                              C   3                                            Gh  4                                            Pb  8                                            tvl   -0.5                                            Ap  -0.5                                            Bp  D       ASSESSMENT AND PLAN    Ruth Wright is a 70 y.o. with:  No diagnosis found.

## 2020-10-27 ENCOUNTER — Ambulatory Visit: Payer: Medicare HMO | Admitting: Obstetrics and Gynecology

## 2021-03-20 ENCOUNTER — Ambulatory Visit
Admission: RE | Admit: 2021-03-20 | Discharge: 2021-03-20 | Disposition: A | Discharge status: Home / Self Care | Payer: Medicare HMO | Source: Ambulatory Visit | Attending: Family Medicine | Admitting: Family Medicine

## 2021-03-20 ENCOUNTER — Other Ambulatory Visit: Payer: Self-pay | Admitting: Family Medicine

## 2021-03-20 DIAGNOSIS — R0781 Pleurodynia: Secondary | ICD-10-CM

## 2022-05-26 IMAGING — MG DIGITAL SCREENING BREAST BILAT IMPLANT W/ TOMO W/ CAD
8 of 11 series · 8 of 27 positions shown · non-contrast
Comparison: Previous exam(s).

CLINICAL DATA: Screening.

EXAM:
DIGITAL SCREENING BILATERAL MAMMOGRAM WITH IMPLANTS, CAD AND TOMO
The patient has prepectoral silicone implants. Standard and implant
displaced views were performed.

[L CC]
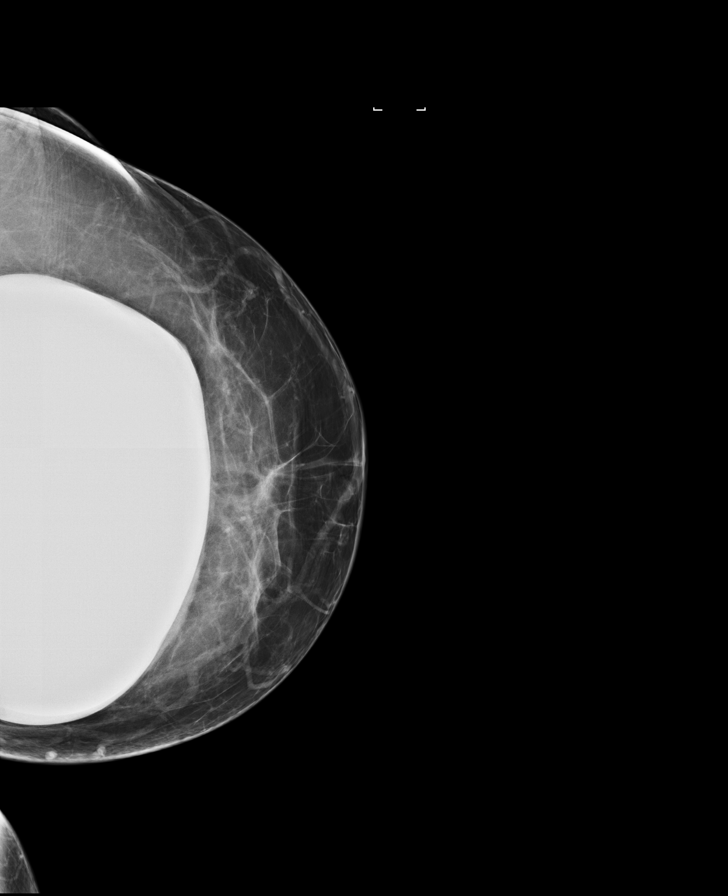

[R CC]
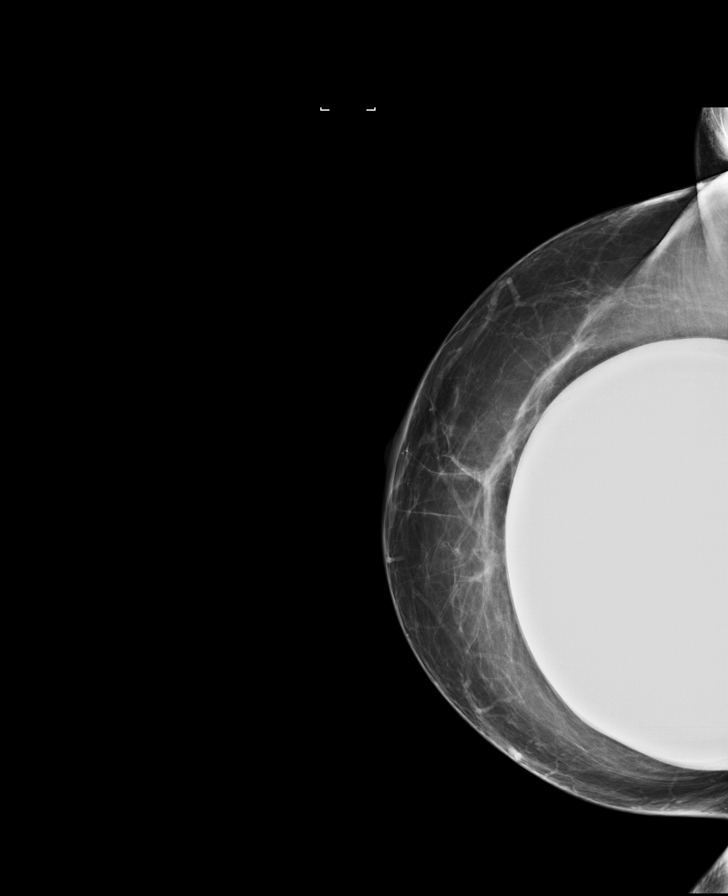

[R MLO]
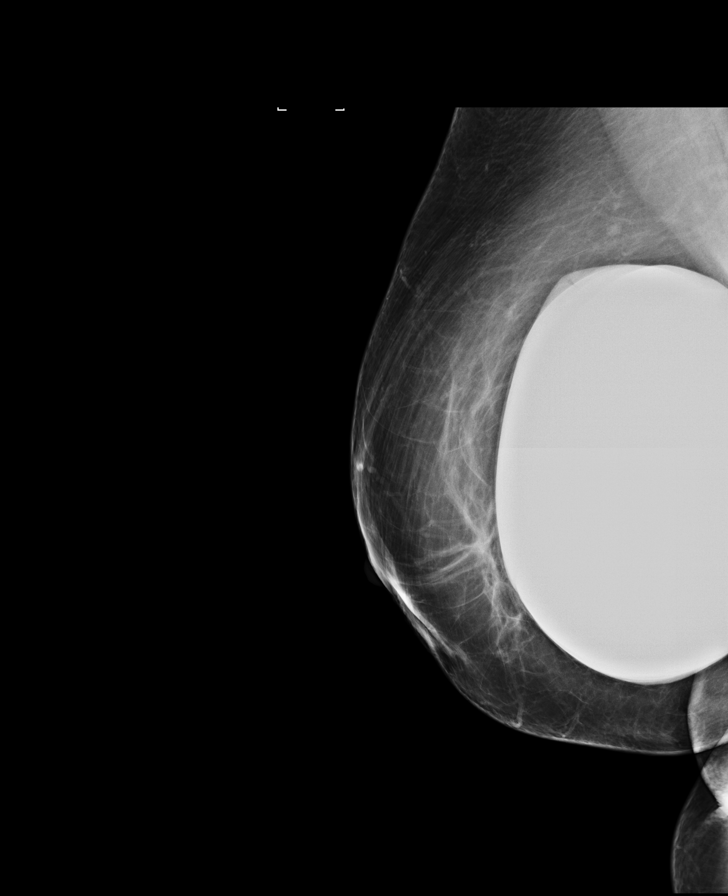

[L MLO]
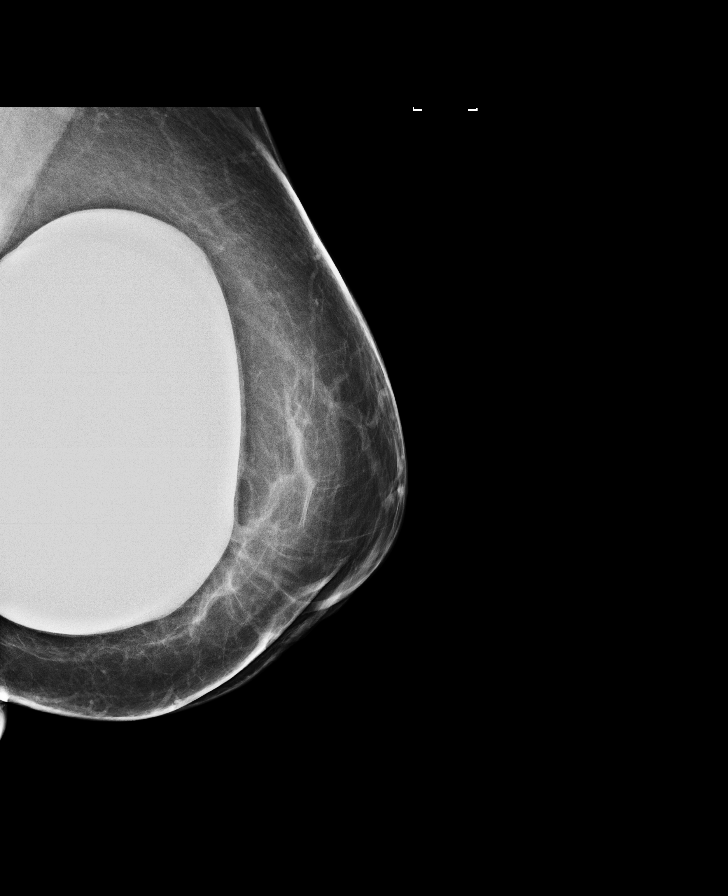

[R MLO synth-2D]
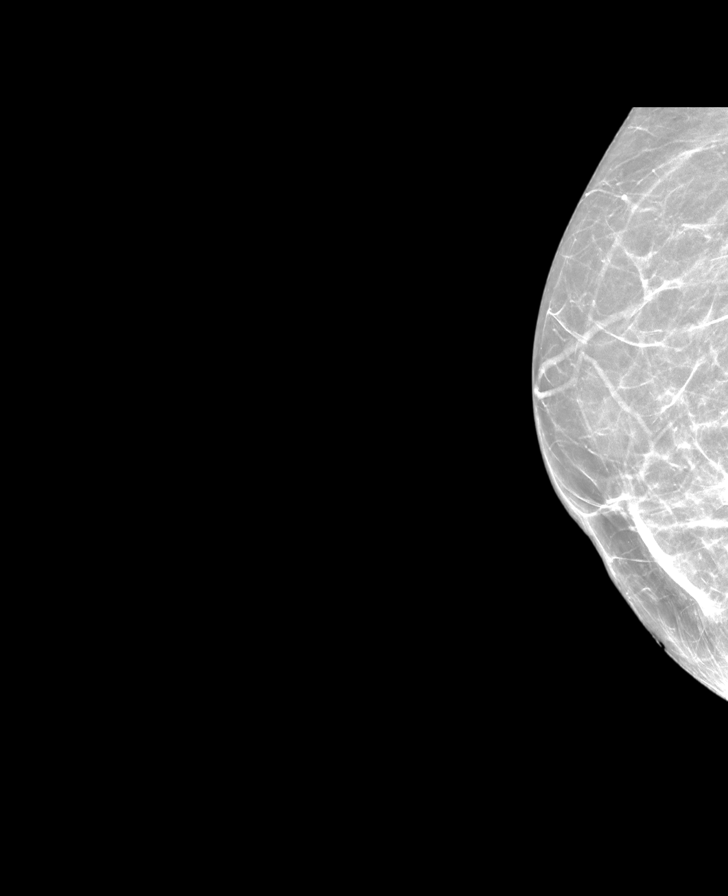

[R CC synth-2D]
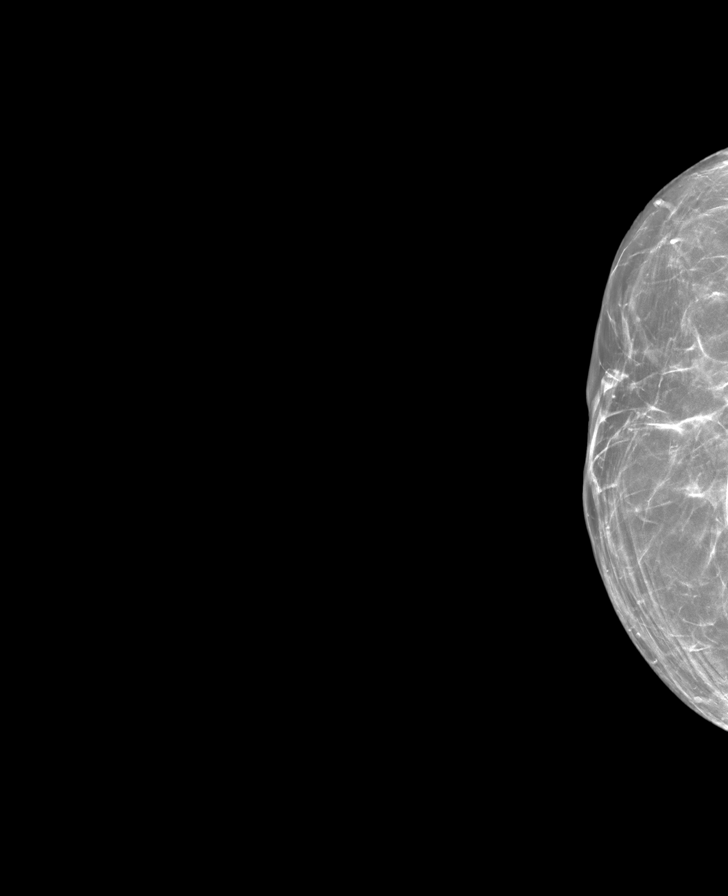

[L MLO synth-2D]
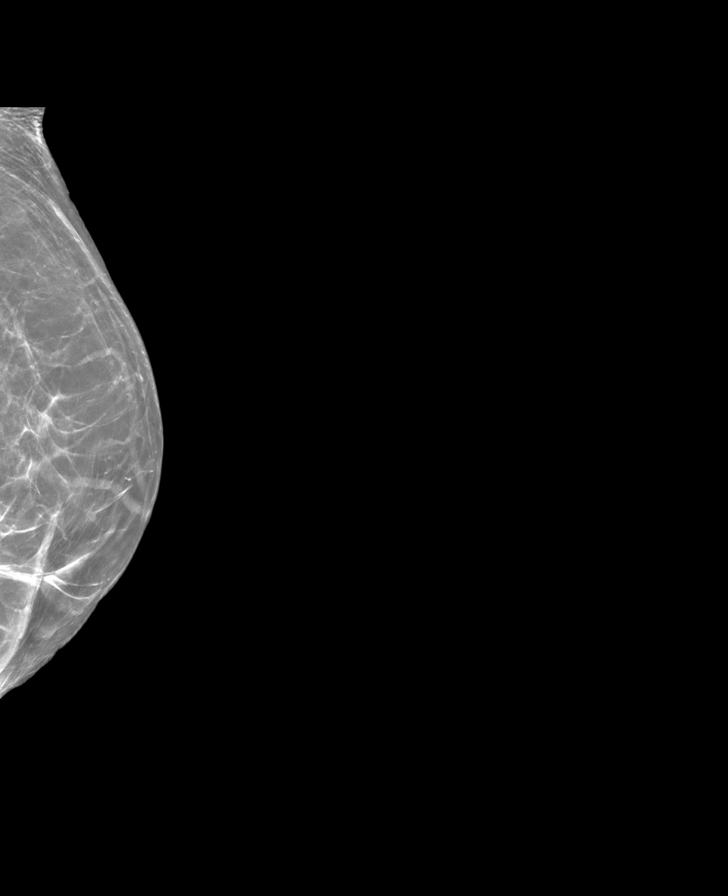

[R MLOID BREAST TOMOSYNTHESIS IMAGE tomo · tomo slice 29/57.0]
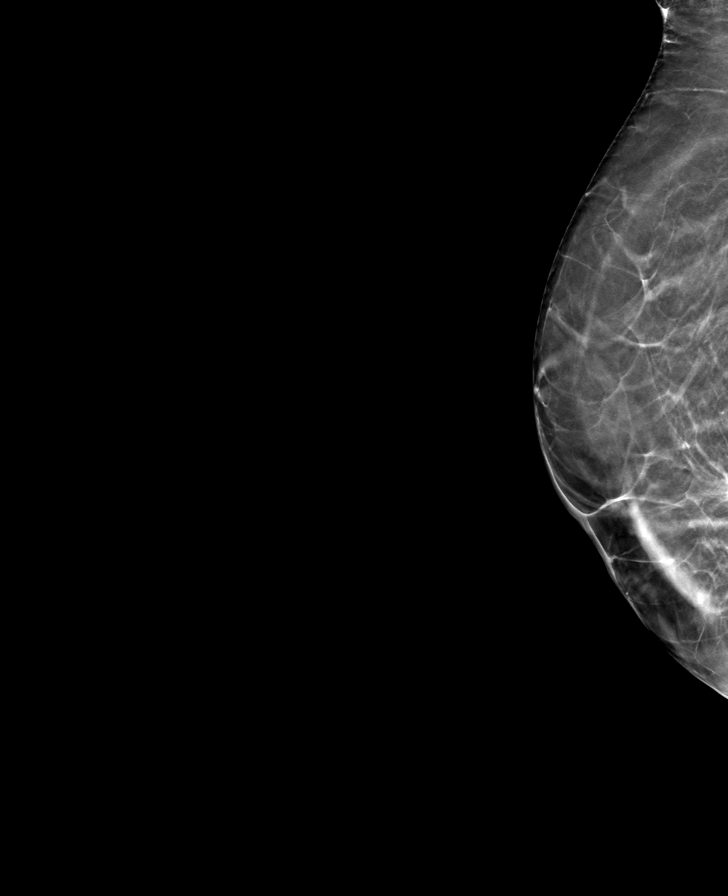

[8 of 27 positions shown; findings below may reference images not displayed]

ACR Breast Density Category b: There are scattered areas of
fibroglandular density.
FINDINGS: There are no findings suspicious for malignancy. Unchanged contours
of silicon implants. Images were processed with CAD.
IMPRESSION: No mammographic evidence of malignancy. A result letter of this
screening mammogram will be mailed directly to the patient.

RECOMMENDATION:
Screening mammogram in one year. (Code:AG-S-VUJ)

BI-RADS CATEGORY  1:  Negative.

## 2023-04-13 IMAGING — CR DG CHEST 2V
2 series · 2 of 2 positions shown · non-contrast
Comparison: 09/29/2017

CLINICAL DATA: Pleuritic chest pain

EXAM:
CHEST - 2 VIEW

[w chest pa]
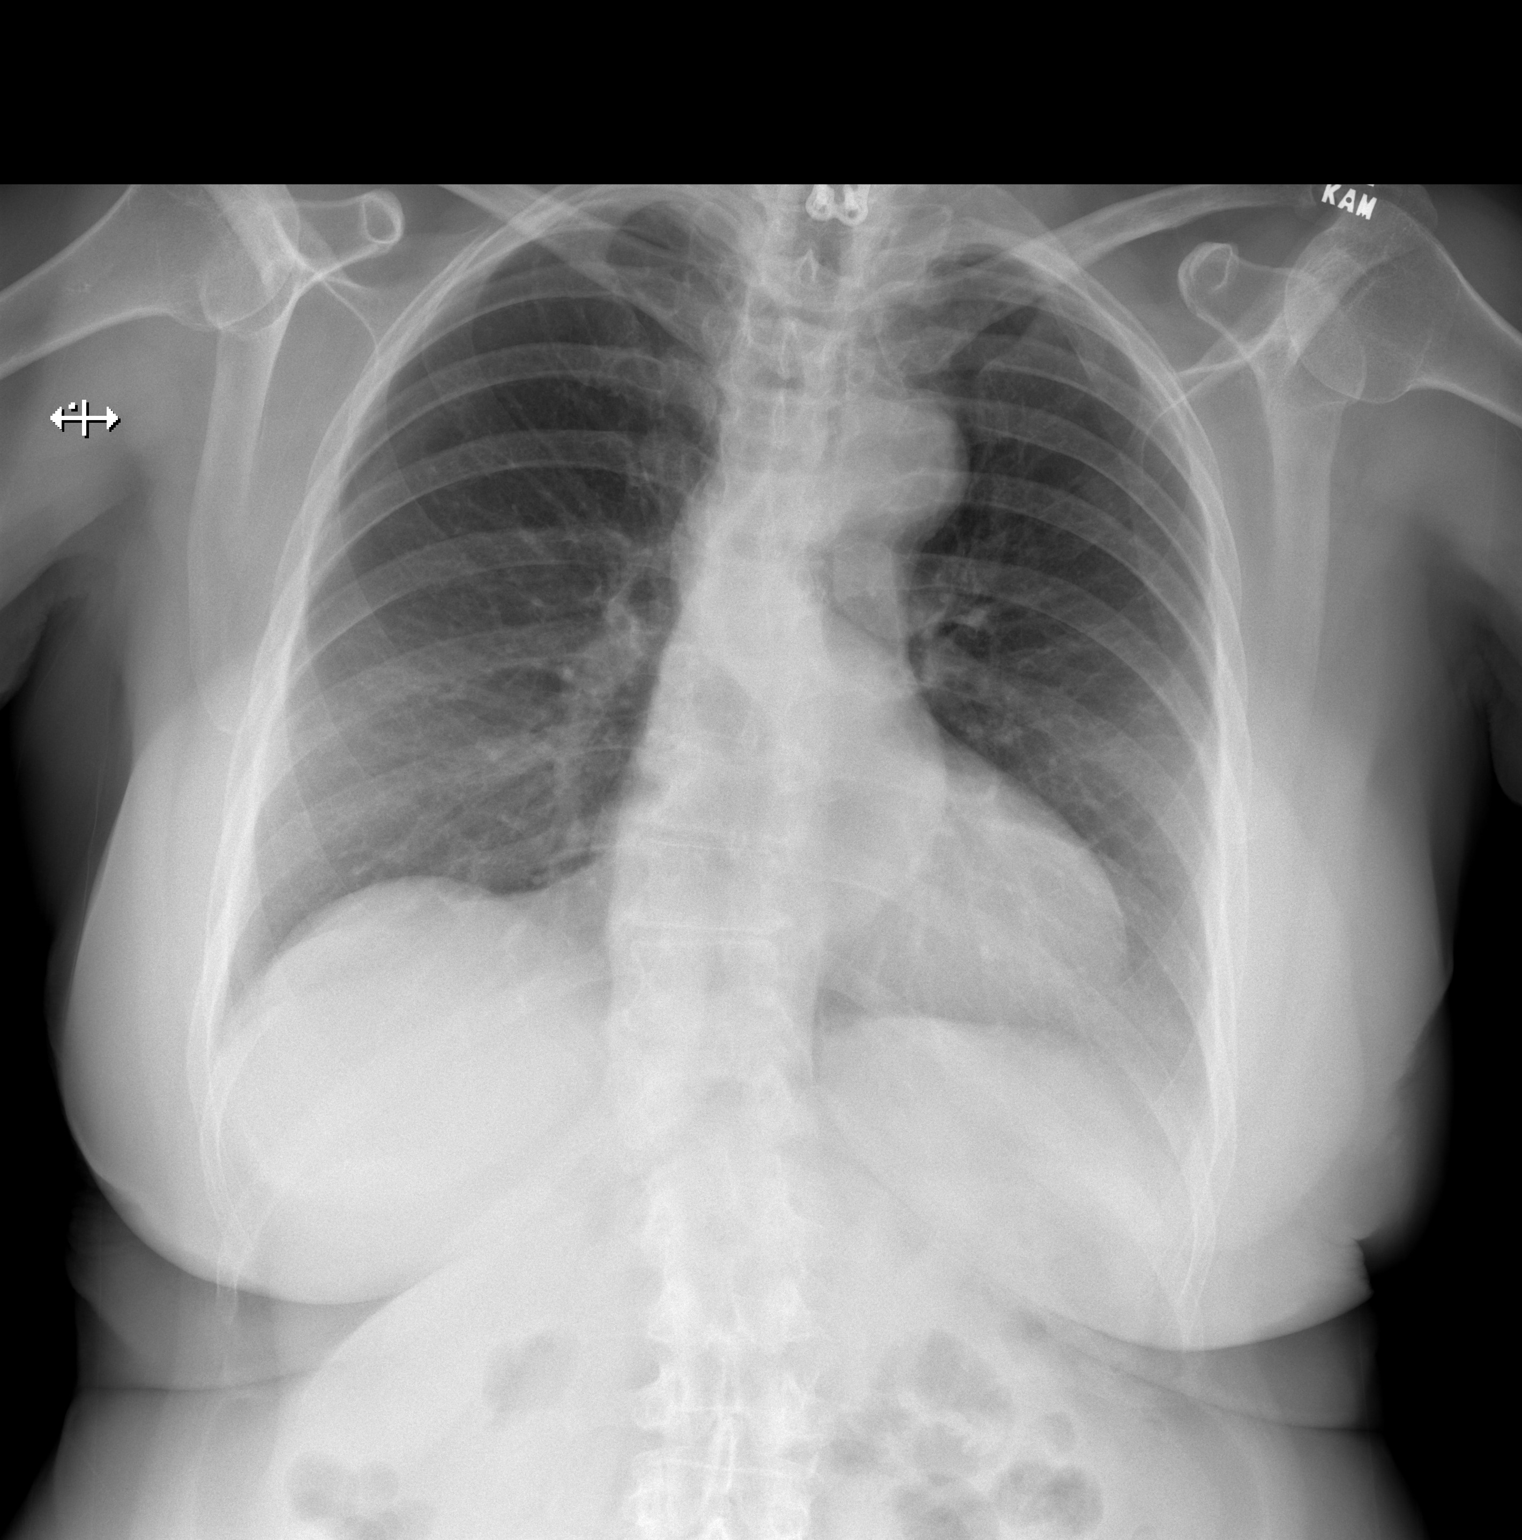

[w chest lat]
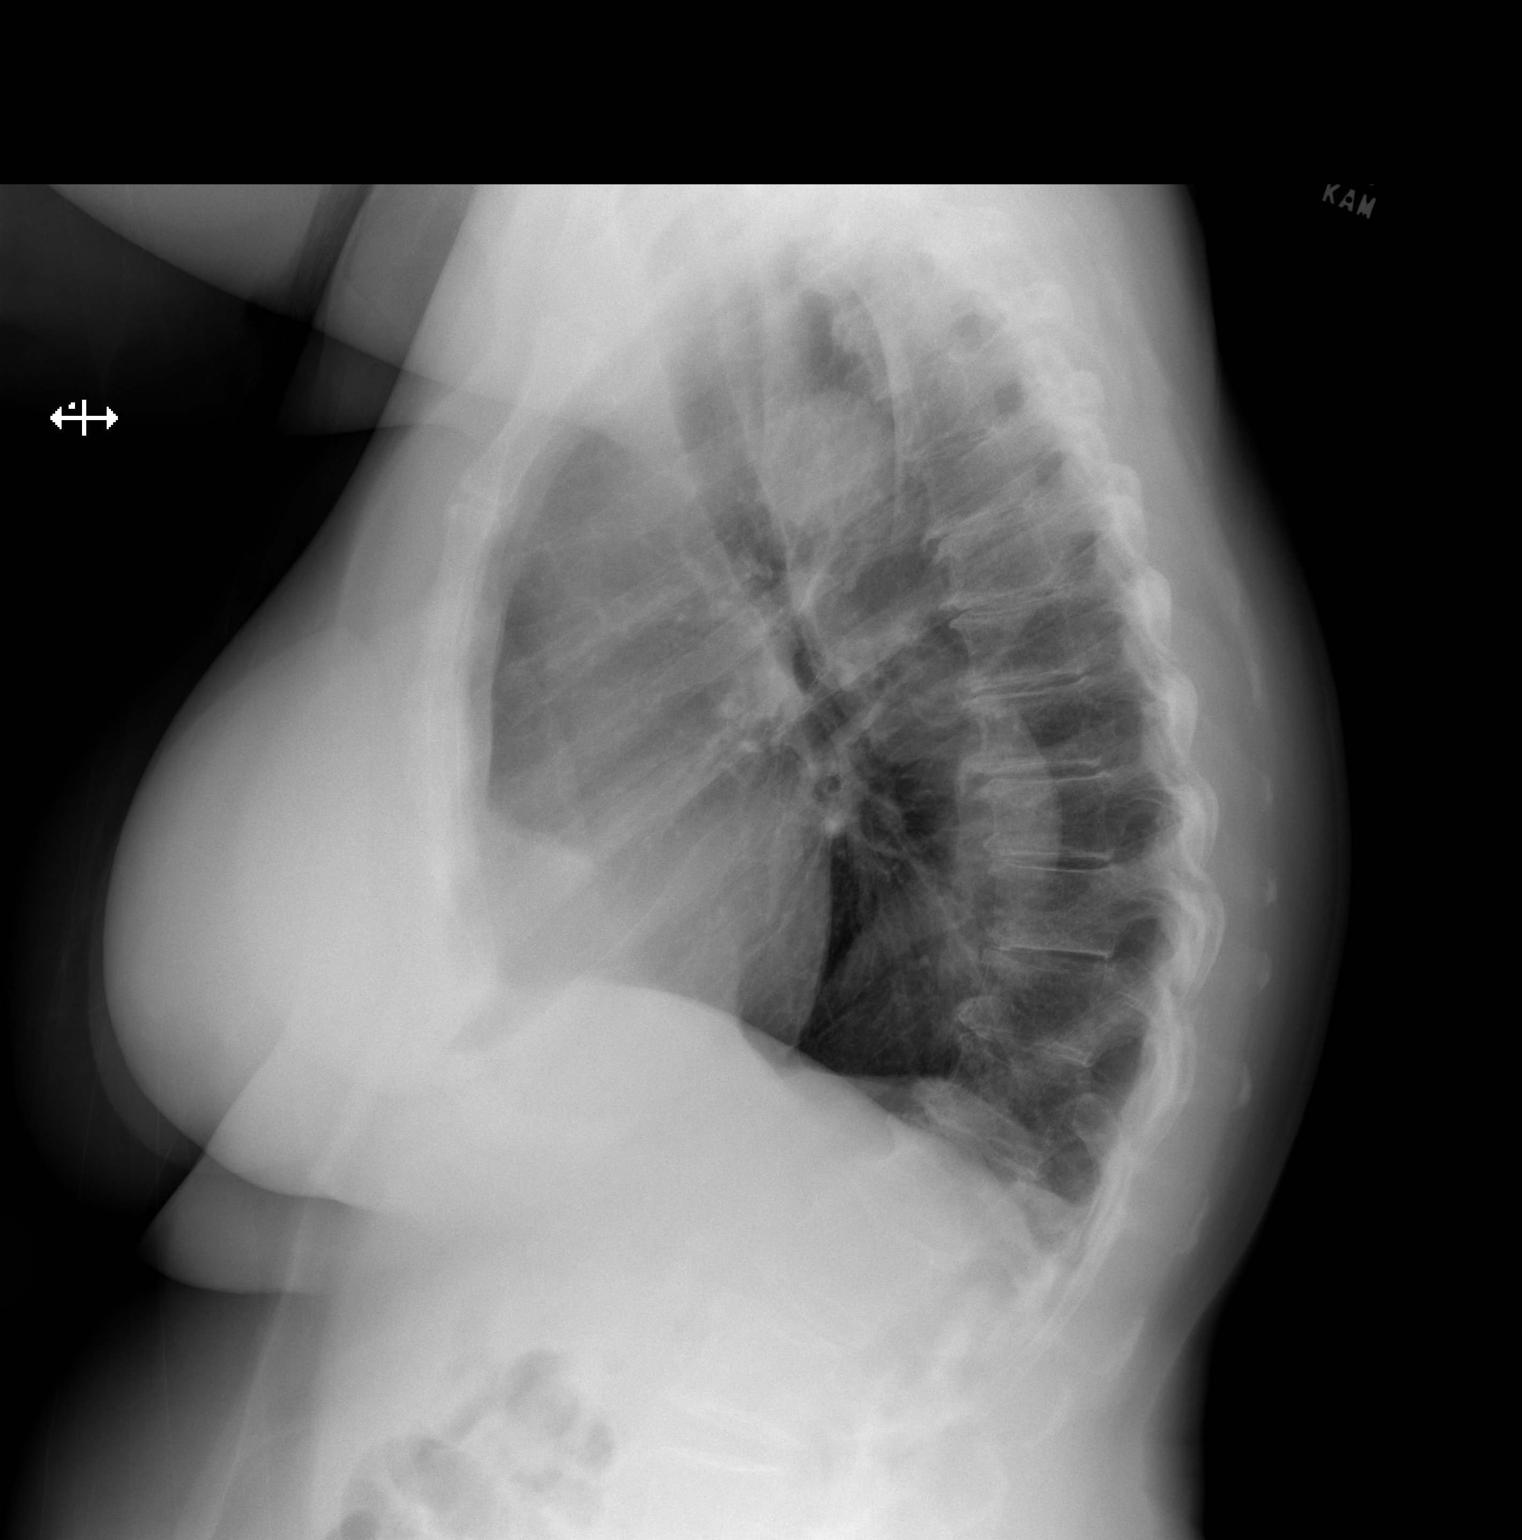

[2 of 2 positions shown; findings below may reference images not displayed]

FINDINGS: The heart size and mediastinal contours are stable. Tortuosity of
the descending thoracic aorta. No focal airspace consolidation,
pleural effusion, or pneumothorax. The visualized skeletal
structures are unremarkable.
IMPRESSION: 1. No active cardiopulmonary disease.
2. Tortuosity of the descending thoracic aorta.
